# Patient Record
Sex: Male | Born: 1995 | Race: White | Hispanic: No | Marital: Single
Health system: Southern US, Community
[De-identification: ages and names within clinical notes are randomized; demographics above are authoritative.]

---

## 2017-12-11 ENCOUNTER — Encounter: Payer: Self-pay | Admitting: Sports Medicine

## 2017-12-11 ENCOUNTER — Ambulatory Visit (INDEPENDENT_AMBULATORY_CARE_PROVIDER_SITE_OTHER): Payer: BC Managed Care – PPO | Admitting: Sports Medicine

## 2017-12-11 ENCOUNTER — Ambulatory Visit: Payer: Self-pay

## 2017-12-11 VITALS — BP 138/90 | Ht 72.0 in | Wt 275.0 lb

## 2017-12-11 DIAGNOSIS — M7989 Other specified soft tissue disorders: Secondary | ICD-10-CM | POA: Diagnosis not present

## 2017-12-11 NOTE — Patient Instructions (Addendum)
[facility]  Dr Chevis PrettyPaul Toth Friday November 22nd 2019 at 1130a Arrival time is 11am [address] [place], [place] [address] Phone: [phone]

## 2017-12-11 NOTE — Progress Notes (Signed)
Chad Riley - 22 y.o. 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  He states that for the past several years, he has had a palpable mass in his thoracic region on the left side.  Since he has continued pitching left-handed for [employer], he has noticed increasing pain in this area over this mass which is new this year.  He has noticed this for the past 2 to 3 months.  Denies any growing in size however his pain has increased to the point where he has had 2 back off on working out and pitching secondary to it.  No history of lipomas or cancers.  Overhead pitching and scapular retraction causes worsening of symptoms.  Relative rest, ice and anti-inflammatories improves his symptoms.  No associated numbness or tingling of the area.  No rashes, purulent drainage or skin lesions.  Denies any midline low back pain or thoracic back pain.  Due to the irritation, he is expressing the desire to have it removed so that it no longer interferes with his collegiate pitching.   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  He is a non-smoker.  He is a Holiday representativesenior at [employer].  Medications: Zoloft and trazodone Allergies: No known drug allergies  OBJECTIVE:  Physical exam: Vital signs are reviewed. BP 138/90   Ht 6' (1.829 m)   Wt 275 lb (124.7 kg)   BMI 37.30 kg/m   Gen.: Alert, oriented, appears stated age, in no apparent distress HEENT: Moist oral mucosa Respiratory: Normal respirations, able to speak in full sentences Cardiac: Regular rate, distal pulses 2+ Integumentary: No overlying skin changes or  puncta visualized.  A soft tissue freely mobile masses palpated at approximately 2-1/2 cm x 1 cm just inferior to the tip of the scapula around T11 region on the left. Neurologic: nonfocal Psych: Normal affect, mood is described as good Musculoskeletal: Full range of motion of all 4 extremities. Back: No midline cervical, thoracic, lumbar spine tenderness.  Diagnostics: ULTRASOUND: L thoracic region of back Quick, limited diagnostic ultrasound obtained of patient's L thoracic soft tissue.  -Soft tissue ultrasound demonstrates a heterogeneous well circumscribed mass within the subcutaneous tissue measuring 2.35 cm in width by 0.95 cm in height in the transverse plane.  Vertical plane demonstrates measurements of 1.04cm in width and 1.89 cm in height.  There are trace hypoechoic septations in the mass.  No evidence of loculated fluid collections.  Doppler blood flow demonstrates the absence of flow.  IMPRESSION: findings consistent with subcutaneous soft tissue mass likely representing a lipoma..    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 US LIMITED JOINT SPACE STRUCTURES LOW RIGHT    Standing Status:   Future    Number of Occurrences:   1    Standing Expiration Date:  02/11/2019    Order Specific Question:   Reason for Exam (SYMPTOM  OR DIAGNOSIS REQUIRED)    Answer:   cyst on low back    Order Specific Question:   Preferred imaging location?    Answer:   Internal      Gustavus Messing, DO Sports Medicine Fellow [place]  I was the preceptor for this visit and present during [place] evaluation. Reino Bellis, DO

## 2017-12-11 NOTE — Assessment & Plan Note (Signed)
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

## 2018-04-01 ENCOUNTER — Emergency Department (HOSPITAL_COMMUNITY)
Admission: EM | Admit: 2018-04-01 | Discharge: 2018-04-01 | Disposition: A | Discharge status: Home / Self Care | Payer: BC Managed Care – PPO | Attending: Emergency Medicine | Admitting: Emergency Medicine

## 2018-04-01 ENCOUNTER — Emergency Department (HOSPITAL_COMMUNITY): Payer: BC Managed Care – PPO

## 2018-04-01 DIAGNOSIS — R55 Syncope and collapse: Secondary | ICD-10-CM | POA: Diagnosis present

## 2018-04-01 DIAGNOSIS — Z79899 Other long term (current) drug therapy: Secondary | ICD-10-CM | POA: Diagnosis not present

## 2018-04-01 LAB — CBC WITH DIFFERENTIAL/PLATELET
Abs Immature Granulocytes: 0.06 10*3/uL (ref 0.00–0.07)
Basophils Absolute: 0.1 10*3/uL (ref 0.0–0.1)
Basophils Relative: 1 %
Eosinophils Absolute: 0.1 10*3/uL (ref 0.0–0.5)
Eosinophils Relative: 0 %
HCT: 46.4 % (ref 39.0–52.0)
Hemoglobin: 15.2 g/dL (ref 13.0–17.0)
Immature Granulocytes: 1 %
Lymphocytes Relative: 16 %
Lymphs Abs: 2.2 10*3/uL (ref 0.7–4.0)
MCH: 28.9 pg (ref 26.0–34.0)
MCHC: 32.8 g/dL (ref 30.0–36.0)
MCV: 88.2 fL (ref 80.0–100.0)
Monocytes Absolute: 0.8 10*3/uL (ref 0.1–1.0)
Monocytes Relative: 6 %
Neutro Abs: 10.1 10*3/uL — ABNORMAL HIGH (ref 1.7–7.7)
Neutrophils Relative %: 76 %
Platelets: 370 10*3/uL (ref 150–400)
RBC: 5.26 MIL/uL (ref 4.22–5.81)
RDW: 12.8 % (ref 11.5–15.5)
WBC: 13.3 10*3/uL — ABNORMAL HIGH (ref 4.0–10.5)
nRBC: 0 % (ref 0.0–0.2)

## 2018-04-01 LAB — BASIC METABOLIC PANEL
Anion gap: 11 (ref 5–15)
BUN: 15 mg/dL (ref 6–20)
CO2: 23 mmol/L (ref 22–32)
Calcium: 9.2 mg/dL (ref 8.9–10.3)
Chloride: 104 mmol/L (ref 98–111)
Creatinine, Ser: 1.13 mg/dL (ref 0.61–1.24)
GFR calc Af Amer: >60 mL/min (ref >60)
GFR calc non Af Amer: >60 mL/min (ref >60)
Glucose, Bld: 87 mg/dL (ref 70–99)
Potassium: 4 mmol/L (ref 3.5–5.1)
Sodium: 138 mmol/L (ref 135–145)

## 2018-04-01 LAB — I-STAT TROPONIN, ED: Troponin i, poc: 0.01 ng/mL (ref 0.00–0.08)

## 2018-04-01 LAB — CBG MONITORING, ED
Glucose-Capillary: 60 mg/dL — ABNORMAL LOW (ref 70–99)
Glucose-Capillary: 108 mg/dL — ABNORMAL HIGH (ref 70–99)

## 2018-04-01 MED ORDER — SODIUM CHLORIDE 0.9 % IV BOLUS
1000.0000 mL | Freq: Once | INTRAVENOUS | Status: AC
  Administered 2018-04-01: 1000 mL via INTRAVENOUS

## 2018-04-01 NOTE — Discharge Instructions (Signed)
As we discussed today, your blood sugar was slightly low. The rest of your blood work was reassuring.   As we discussed, you need to follow-up with your cardiologist.  Please do not participate in any sports or physical activity until you see cardiology.  Return emergency department for any repeat episodes, chest pain, difficulty breathing or any other worsening or concerning symptoms.

## 2018-04-01 NOTE — ED Provider Notes (Signed)
[facility] EMERGENCY DEPARTMENT Provider Note   CSN: [id number] Arrival date & time: 04/01/18  1706    History   Chief Complaint No chief complaint on file.   HPI Chad Riley is a 23 y.o. male with PMH/o SVT with ablation (2016) who presents via EMS for evaluation of syncopal episode.  Patient reports that he was at baseball practice when he started feeling lightheaded and felt like he was 1 to pass out.  He reports that he laid supine on the ground and then had a witnessed episode of syncope.  Patient states that he did not have any chest pain, difficulty breathing.  Patient did have positive LOC.  Patient reports that he was unconscious for a few minutes before the athletic trainer came and woke him up.  Patient states that he did not hit his head or fall to the ground.  On ED arrival, patient with no complaints of chest pain, difficulty breathing.  He states he feels tired but otherwise has no other complaints.  He states he did not have any palpitations.  He had a history of SVT in 2016 was followed by cardiology.  He has not seen them since the ablation.  He states he has not had any symptoms.  Patient states he did not drink any water prior to going to baseball practice. He denies any exogenous hormone use, recent immobilization, prior history of DVT/PE, recent surgery, leg swelling, or long travel.      The history is provided by the patient.    No past medical history on file.  Patient Active Problem List   Diagnosis Date Noted  . Soft tissue mass 12/11/2017    No past surgical history on file.      Home Medications    Prior to Admission medications   Medication Sig Start Date End Date Taking? Authorizing Provider  sertraline (ZOLOFT) 50 MG tablet Take by mouth. 11/28/17   [provider]  traZODone (DESYREL) 50 MG tablet Take by mouth. 11/28/17   [provider]    Family History No family history on file.  Social History Social  History   Tobacco Use  . Smoking status: Not on file  Substance Use Topics  . Alcohol use: Not on file  . Drug use: Not on file     Allergies   Patient has no known allergies.   Review of Systems Review of Systems  Constitutional: Negative for fever.  Respiratory: Negative for cough and shortness of breath.   Cardiovascular: Negative for chest pain and leg swelling.  Gastrointestinal: Negative for abdominal pain, nausea and vomiting.  Genitourinary: Negative for dysuria and hematuria.  Neurological: Positive for syncope and light-headedness. Negative for headaches.  All other systems reviewed and are negative.    Physical Exam Updated Vital Signs BP 126/69   Pulse 74   Temp 98.3 F (36.8 C) (Oral)   Resp (!) 21   Ht 6' (1.829 m)   Wt 127 kg   SpO2 100%   BMI 37.97 kg/m   Physical Exam Vitals signs and nursing note reviewed.  Constitutional:      Appearance: Normal appearance. He is well-developed.  HENT:     Head: Normocephalic and atraumatic.  Eyes:     General: Lids are normal.     Conjunctiva/sclera: Conjunctivae normal.     Pupils: Pupils are equal, round, and reactive to light.  Neck:     Musculoskeletal: Full passive range of motion without pain.  Cardiovascular:     Rate and Rhythm: Normal rate and regular rhythm.     Pulses: Normal pulses.     Heart sounds: Normal heart sounds. No murmur. No friction rub. No gallop.      Comments: No m/r/g Pulmonary:     Effort: Pulmonary effort is normal.     Breath sounds: Normal breath sounds.     Comments: Lungs clear to auscultation bilaterally.  Symmetric chest rise.  No wheezing, rales, rhonchi. Abdominal:     Palpations: Abdomen is soft. Abdomen is not rigid.     Tenderness: There is no abdominal tenderness. There is no guarding.  Musculoskeletal: Normal range of motion.     Comments: Bilateral lower extremities are symmetric in appearance without any overlying warmth, erythema, or edema.   Skin:     General: Skin is warm and dry.     Capillary Refill: Capillary refill takes less than 2 seconds.  Neurological:     Mental Status: He is alert and oriented to person, place, and time.     Comments: Follows commands, Moves all extremities  5/5 strength to BUE and BLE  Sensation intact throughout all major nerve distributions  Psychiatric:        Speech: Speech normal.      ED Treatments / Results  Labs (all labs ordered are listed, but only abnormal results are displayed) Labs Reviewed  CBC WITH DIFFERENTIAL/PLATELET - Abnormal; Notable for the following components:      Result Value   WBC 13.3 (*)    Neutro Abs 10.1 (*)    All other components within normal limits  CBG MONITORING, ED - Abnormal; Notable for the following components:   Glucose-Capillary 60 (*)    All other components within normal limits  CBG MONITORING, ED - Abnormal; Notable for the following components:   Glucose-Capillary 108 (*)    All other components within normal limits  BASIC METABOLIC PANEL  I-STAT TROPONIN, ED    EKG EKG Interpretation  Date/Time:  Tuesday April 01 2018 17:20:14 EST Ventricular Rate:  88 PR Interval:    QRS Duration: 119 QT Interval:  364 QTC Calculation: 441 R Axis:   68 Text Interpretation:  Sinus rhythm Right atrial enlargement Incomplete right bundle branch block ST elev, probable normal early repol pattern No previous ECGs available Confirmed by Richardean Canal ([id number]) on 04/01/2018 5:23:34 PM   Radiology Dg Chest 2 View  Result Date: 04/01/2018 CLINICAL DATA:  Syncope at baseball practice today. EXAM: CHEST - 2 VIEW COMPARISON:  None. FINDINGS: Cardiomediastinal silhouette is normal. No pleural effusions or focal consolidations. Trachea projects midline and there is no pneumothorax. Soft tissue planes and included osseous structures are non-suspicious. IMPRESSION: Negative. Electronically Signed   By: Awilda Metro M.D.   On: 04/01/2018 19:41    Procedures Procedures  (including critical care time)  Medications Ordered in ED Medications  sodium chloride 0.9 % bolus 1,000 mL (0 mLs Intravenous Stopped 04/01/18 2017)     Initial Impression / Assessment and Plan / ED Course  I have reviewed the triage vital signs and the nursing notes.  Pertinent labs & imaging results that were available during my care of the patient were reviewed by me and considered in my medical decision making (see chart for details).        23 y.o. M with PMH/o SVT with ablation (2016) who presents for evaluation of syncopal episode while at baseball practice. Patient reports he was at baseball practice when  he felt lightheaded. He laid down on the ground and proceeded to have a syncopal event. No CP, SOB. He denies any palpitations.  He has a history of SVT with ablation done in 2016.  He has not seen cardiology since then.  On ED arrival, he does not have any complaints at this time.  Check EKG, basic labs, chest x-ray.  POC CBG shows blood glucose 60.  BMP shows normal BUN and creatinine.  CBC shows slight leukocytosis at 13.3.  Troponin negative.  Chest x-ray unremarkable.  Repeat CBG after patient eating is 108.  Review of records from 2016.  He had an echo after his ablation that was normal.  No evidence of any acute abnormalities.  Discussed results with patient.  Patient denies any symptoms at this time.  Patient was able to ambulate in the ED without any difficulty.  Vital signs stable.  I did discuss with patient that he would likely need cardiology follow-up given his history.  Instructed him to refrain from sports or physical activity until he is seen by cardiology. At this time, patient exhibits no emergent life-threatening condition that require further evaluation in ED or admission. Discussed patient with Dr. Silverio Lay who agrees with plan.  Patient had ample opportunity for questions and discussion. All patient's questions were answered with full understanding. Strict return  precautions discussed. Patient expresses understanding and agreement to plan.   Portions of this note were generated with Scientist, clinical (histocompatibility and immunogenetics). Dictation errors may occur despite best attempts at proofreading.   Final Clinical Impressions(s) / ED Diagnoses   Final diagnoses:  Syncope, unspecified syncope type    ED Discharge Orders    None       Rosana Hoes 04/01/18 2259    Charlynne Pander, MD 04/01/18 2351

## 2018-04-01 NOTE — ED Notes (Signed)
All appropriate discharge materials reviewed with patient at length. Time for questions provided. Pt denies any further questions at this time. Verbalizes understanding of all provided materials.  

## 2018-04-01 NOTE — ED Triage Notes (Addendum)
Pt here for evaluation after a syncopal episode this afternoon at baseball practice. He became lightheaded, placed himself on the ground supine, and states that he "felt like he passed out." Hx cardiac arrhythmias/ablation in 2016. Sinus tach for EMS at 110.

## 2018-04-01 NOTE — ED Notes (Signed)
Patient transported to X-ray 

## 2019-12-15 IMAGING — DX DG CHEST 2V
2 series · 2 of 2 positions shown · non-contrast
Comparison: None.

CLINICAL DATA: Syncope at baseball practice today.

EXAM:
CHEST - 2 VIEW

[chest pa]
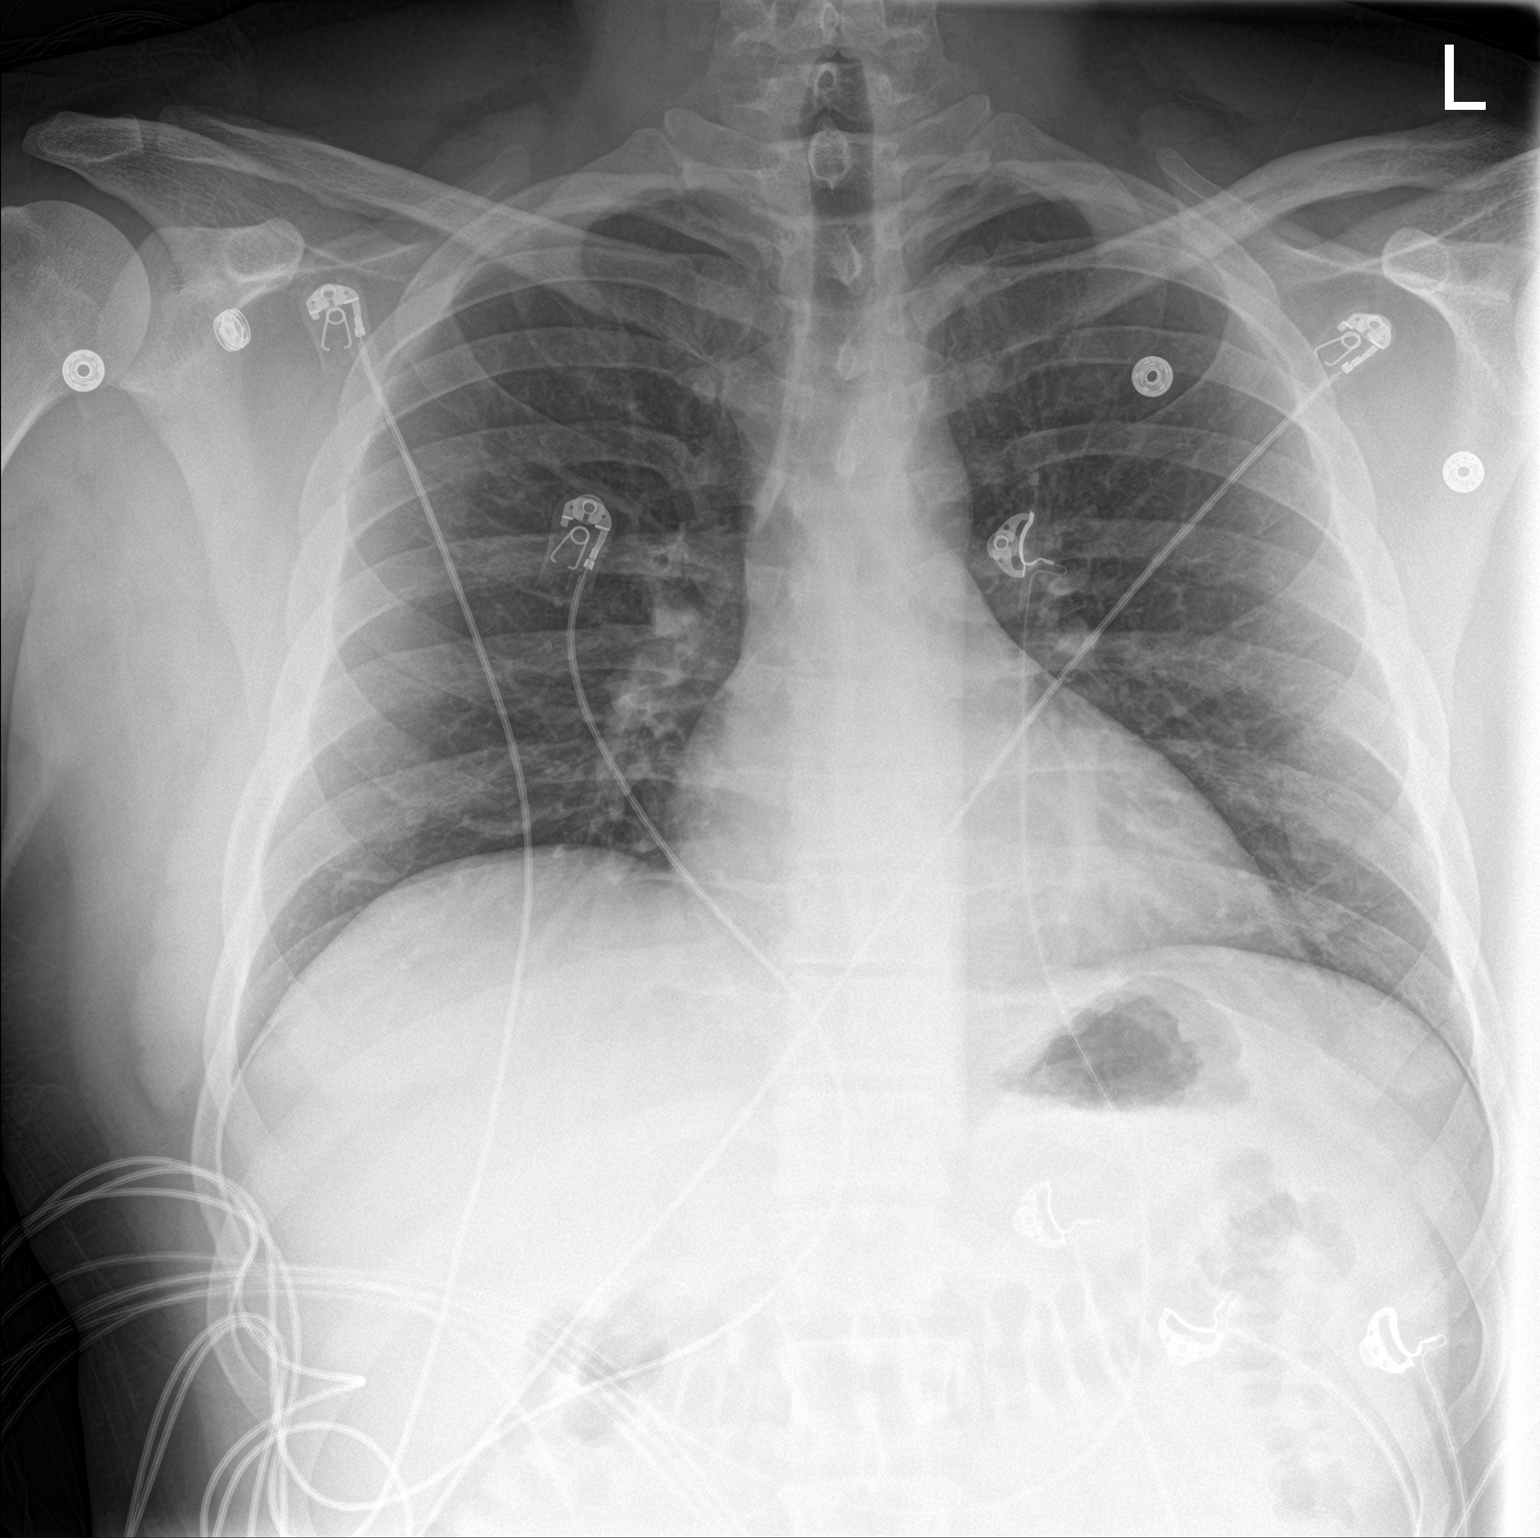

[chest lat]
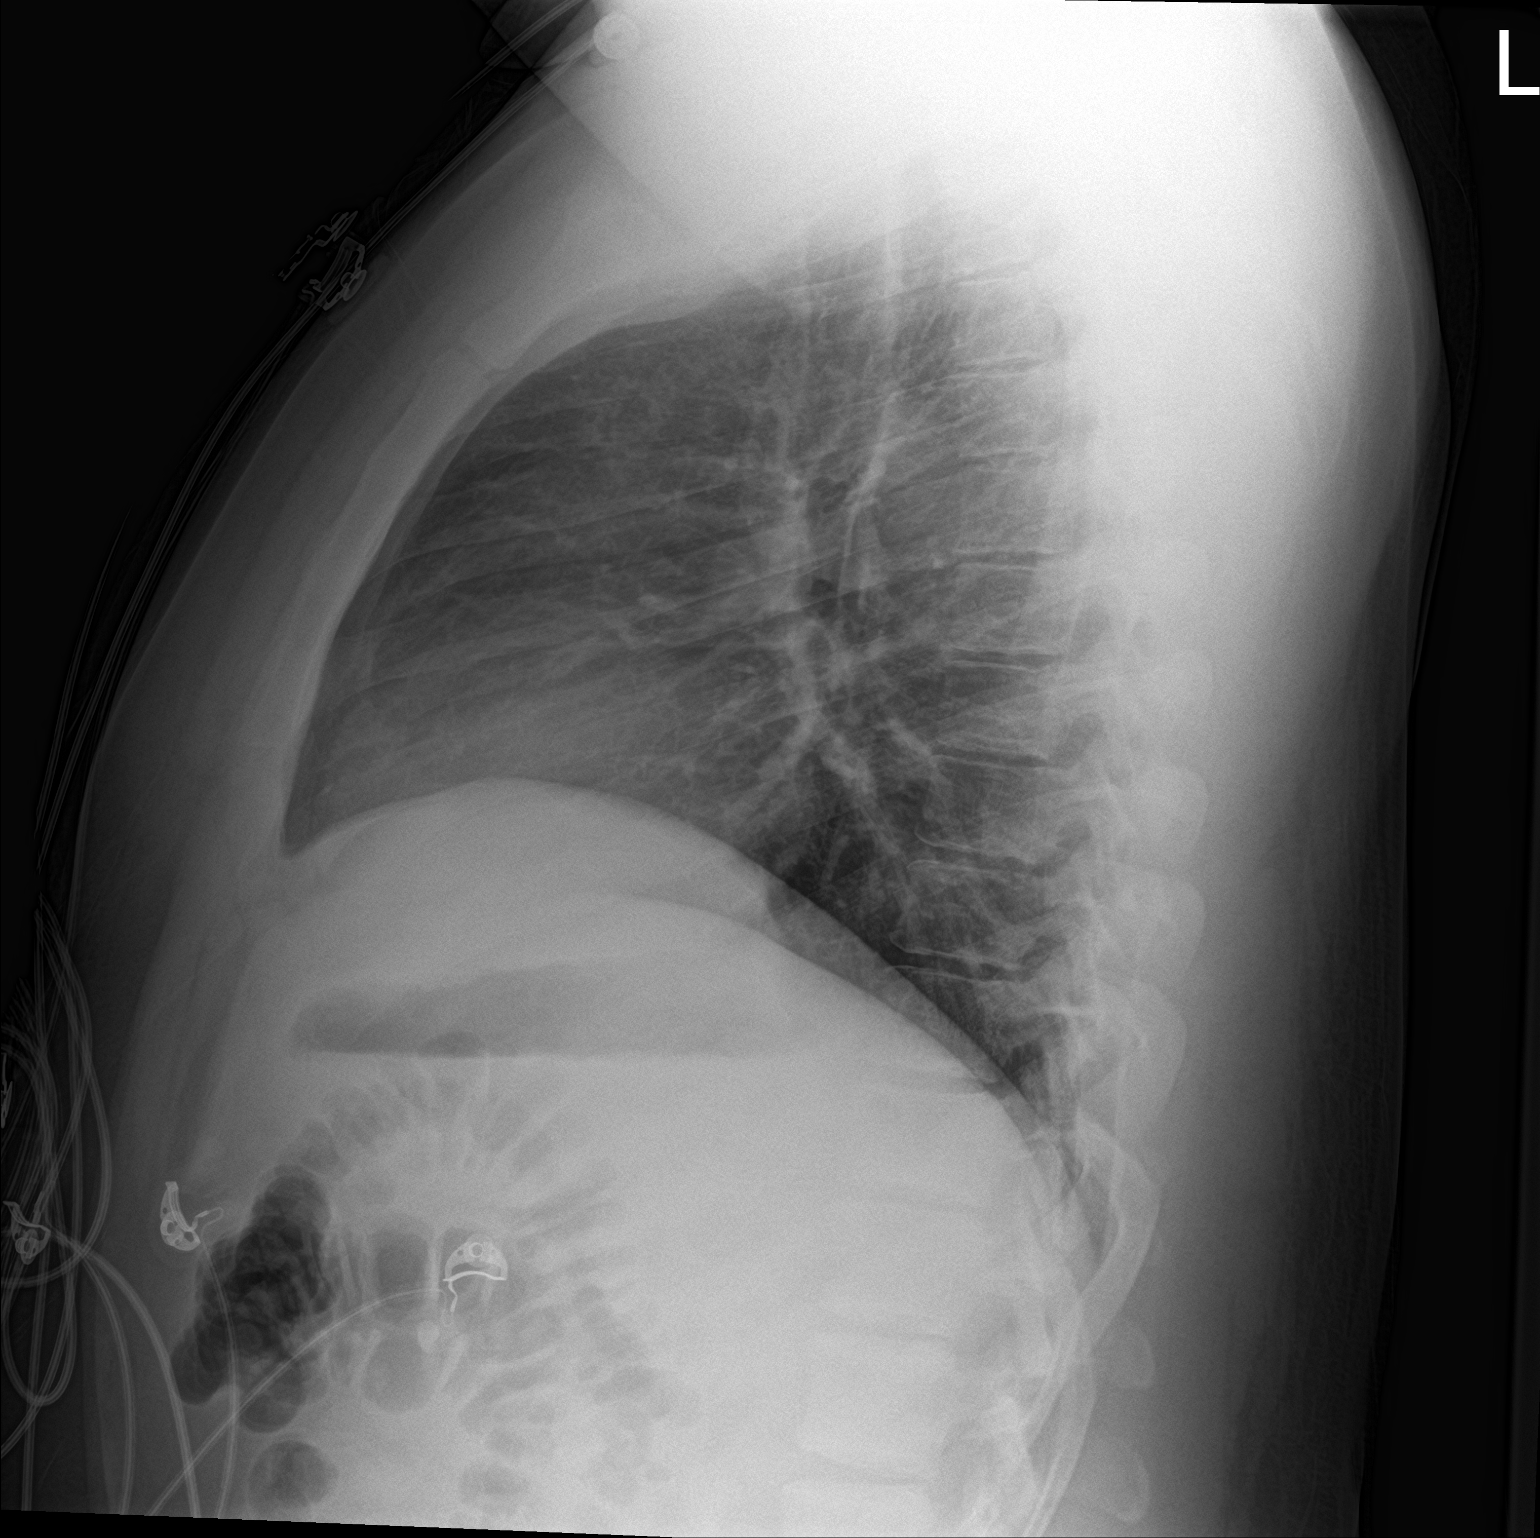

[2 of 2 positions shown; findings below may reference images not displayed]

FINDINGS: Cardiomediastinal silhouette is normal. No pleural effusions or
focal consolidations. Trachea projects midline and there is no
pneumothorax. Soft tissue planes and included osseous structures are
non-suspicious.
IMPRESSION: Negative.
